# Patient Record
Sex: Male | Born: 1965 | Race: White | Hispanic: No | Marital: Married | Smoking: Never smoker
Health system: Southern US, Community
[De-identification: ages and names within clinical notes are randomized; demographics above are authoritative.]

## PROBLEM LIST (undated history)

## (undated) DIAGNOSIS — Z87442 Personal history of urinary calculi: Secondary | ICD-10-CM

## (undated) DIAGNOSIS — M1990 Unspecified osteoarthritis, unspecified site: Secondary | ICD-10-CM

## (undated) DIAGNOSIS — I10 Essential (primary) hypertension: Secondary | ICD-10-CM

## (undated) DIAGNOSIS — E119 Type 2 diabetes mellitus without complications: Secondary | ICD-10-CM

---

## 1997-07-12 HISTORY — PX: DG THUMB RIGHT HAND (ARMC HX): HXRAD1825

## 1999-03-31 ENCOUNTER — Encounter: Admission: RE | Admit: 1999-03-31 | Discharge: 1999-06-29 | Payer: Self-pay | Admitting: Family Medicine

## 1999-09-08 ENCOUNTER — Encounter: Admission: RE | Admit: 1999-09-08 | Discharge: 1999-12-07 | Payer: Self-pay | Admitting: Family Medicine

## 2018-05-22 ENCOUNTER — Other Ambulatory Visit: Payer: Self-pay | Admitting: Family Medicine

## 2018-05-22 DIAGNOSIS — R109 Unspecified abdominal pain: Secondary | ICD-10-CM

## 2018-05-23 ENCOUNTER — Ambulatory Visit
Admission: RE | Admit: 2018-05-23 | Discharge: 2018-05-23 | Disposition: A | Discharge status: Home / Self Care | Payer: No Typology Code available for payment source | Source: Ambulatory Visit | Attending: Family Medicine | Admitting: Family Medicine

## 2018-05-23 DIAGNOSIS — R109 Unspecified abdominal pain: Secondary | ICD-10-CM

## 2018-12-08 ENCOUNTER — Ambulatory Visit
Admission: RE | Admit: 2018-12-08 | Discharge: 2018-12-08 | Disposition: A | Discharge status: Home / Self Care | Payer: No Typology Code available for payment source | Source: Ambulatory Visit | Attending: Family Medicine | Admitting: Family Medicine

## 2018-12-08 ENCOUNTER — Other Ambulatory Visit: Payer: Self-pay

## 2018-12-08 ENCOUNTER — Other Ambulatory Visit: Payer: Self-pay | Admitting: Family Medicine

## 2018-12-08 DIAGNOSIS — N200 Calculus of kidney: Secondary | ICD-10-CM

## 2019-01-02 ENCOUNTER — Other Ambulatory Visit: Payer: Self-pay | Admitting: Urology

## 2019-01-02 ENCOUNTER — Encounter (HOSPITAL_COMMUNITY): Payer: Self-pay | Admitting: *Deleted

## 2019-01-02 NOTE — Progress Notes (Addendum)
Spoke to patient via phone,history obtained,updated.  Bring blue folder,insurance cards,picture ID,designated driver and living will,POA, if desires (to be placed on chart). Reinforced no aspirin(instructions to hold aspirin per your doctor), ibuprofen products 72 hours prior to procedure. No vitamins or herbal medicines 7 days prior to procedure.   Follow laxative instructions provided by urologist (office) and in blue folder. Wear easy on/off clothing and no jewelry except wedding rings and ear rings. Leave all other valuables at home. Verbalizes understanding of instructions  No alcohol 24 hours prior to procedure.call md office if you have a cold,sorethroat or fever. Shower or bathe before your treatment.NPO after midnight. You will need a covid test prior to our procedure.  01/03/2019 Called patient and informed him that he would not need covid testing as anesthesia is not involved.

## 2019-01-08 ENCOUNTER — Ambulatory Visit (HOSPITAL_COMMUNITY)
Admission: RE | Admit: 2019-01-08 | Discharge: 2019-01-08 | Disposition: A | Discharge status: Home / Self Care | Payer: No Typology Code available for payment source | Source: Other Acute Inpatient Hospital | Attending: Urology | Admitting: Urology

## 2019-01-08 ENCOUNTER — Encounter (HOSPITAL_COMMUNITY): Payer: Self-pay | Admitting: General Practice

## 2019-01-08 ENCOUNTER — Encounter (HOSPITAL_COMMUNITY)
Admission: RE | Disposition: A | Discharge status: Home / Self Care | Payer: Self-pay | Source: Other Acute Inpatient Hospital | Attending: Urology

## 2019-01-08 ENCOUNTER — Ambulatory Visit (HOSPITAL_COMMUNITY): Payer: No Typology Code available for payment source

## 2019-01-08 DIAGNOSIS — Z7984 Long term (current) use of oral hypoglycemic drugs: Secondary | ICD-10-CM | POA: Diagnosis not present

## 2019-01-08 DIAGNOSIS — N201 Calculus of ureter: Secondary | ICD-10-CM | POA: Diagnosis not present

## 2019-01-08 DIAGNOSIS — Z79899 Other long term (current) drug therapy: Secondary | ICD-10-CM | POA: Insufficient documentation

## 2019-01-08 HISTORY — PX: EXTRACORPOREAL SHOCK WAVE LITHOTRIPSY: SHX1557

## 2019-01-08 HISTORY — DX: Unspecified osteoarthritis, unspecified site: M19.90

## 2019-01-08 HISTORY — DX: Type 2 diabetes mellitus without complications: E11.9

## 2019-01-08 HISTORY — DX: Personal history of urinary calculi: Z87.442

## 2019-01-08 HISTORY — DX: Essential (primary) hypertension: I10

## 2019-01-08 LAB — GLUCOSE, CAPILLARY: Glucose-Capillary: 104 mg/dL — ABNORMAL HIGH (ref 70–99)

## 2019-01-08 SURGERY — LITHOTRIPSY, ESWL
Anesthesia: LOCAL | Laterality: Right

## 2019-01-08 MED ORDER — TAMSULOSIN HCL 0.4 MG PO CAPS: 0.4000 mg | ORAL_CAPSULE | Freq: Every day | ORAL | 1 refills | Status: AC

## 2019-01-08 MED ORDER — SODIUM CHLORIDE 0.9 % IV SOLN
INTRAVENOUS | Status: DC
  Administered 2019-01-08: 10:00:00 via INTRAVENOUS

## 2019-01-08 MED ORDER — DIPHENHYDRAMINE HCL 25 MG PO CAPS
25.0000 mg | ORAL_CAPSULE | ORAL | Status: AC
  Administered 2019-01-08: 25 mg via ORAL
  Filled 2019-01-08: qty 1

## 2019-01-08 MED ORDER — DIAZEPAM 5 MG PO TABS
10.0000 mg | ORAL_TABLET | ORAL | Status: AC
  Administered 2019-01-08: 10 mg via ORAL
  Filled 2019-01-08: qty 2

## 2019-01-08 MED ORDER — OXYCODONE-ACETAMINOPHEN 5-325 MG PO TABS: 1.0000 | ORAL_TABLET | ORAL | 0 refills | Status: AC | PRN

## 2019-01-08 MED ORDER — CIPROFLOXACIN HCL 500 MG PO TABS
500.0000 mg | ORAL_TABLET | ORAL | Status: AC
  Administered 2019-01-08: 500 mg via ORAL
  Filled 2019-01-08: qty 1

## 2019-01-08 NOTE — Discharge Instructions (Signed)

## 2019-01-09 ENCOUNTER — Encounter (HOSPITAL_COMMUNITY): Payer: Self-pay | Admitting: Urology

## 2019-02-19 ENCOUNTER — Other Ambulatory Visit: Payer: Self-pay | Admitting: Urology

## 2019-03-13 ENCOUNTER — Ambulatory Visit: Admit: 2019-03-13 | Payer: No Typology Code available for payment source | Admitting: Urology

## 2019-03-13 SURGERY — CYSTOSCOPY/URETEROSCOPY/HOLMIUM LASER/STENT PLACEMENT
Anesthesia: General | Laterality: Right

## 2020-01-13 IMAGING — DX ABDOMEN - 1 VIEW
2 series · 2 of 2 positions shown · non-contrast
Comparison: 12/08/2018 abdominal CT

CLINICAL DATA: Preop for right ureteral stone

EXAM:
ABDOMEN - 1 VIEW

[abdomen kub (1 of 2)]
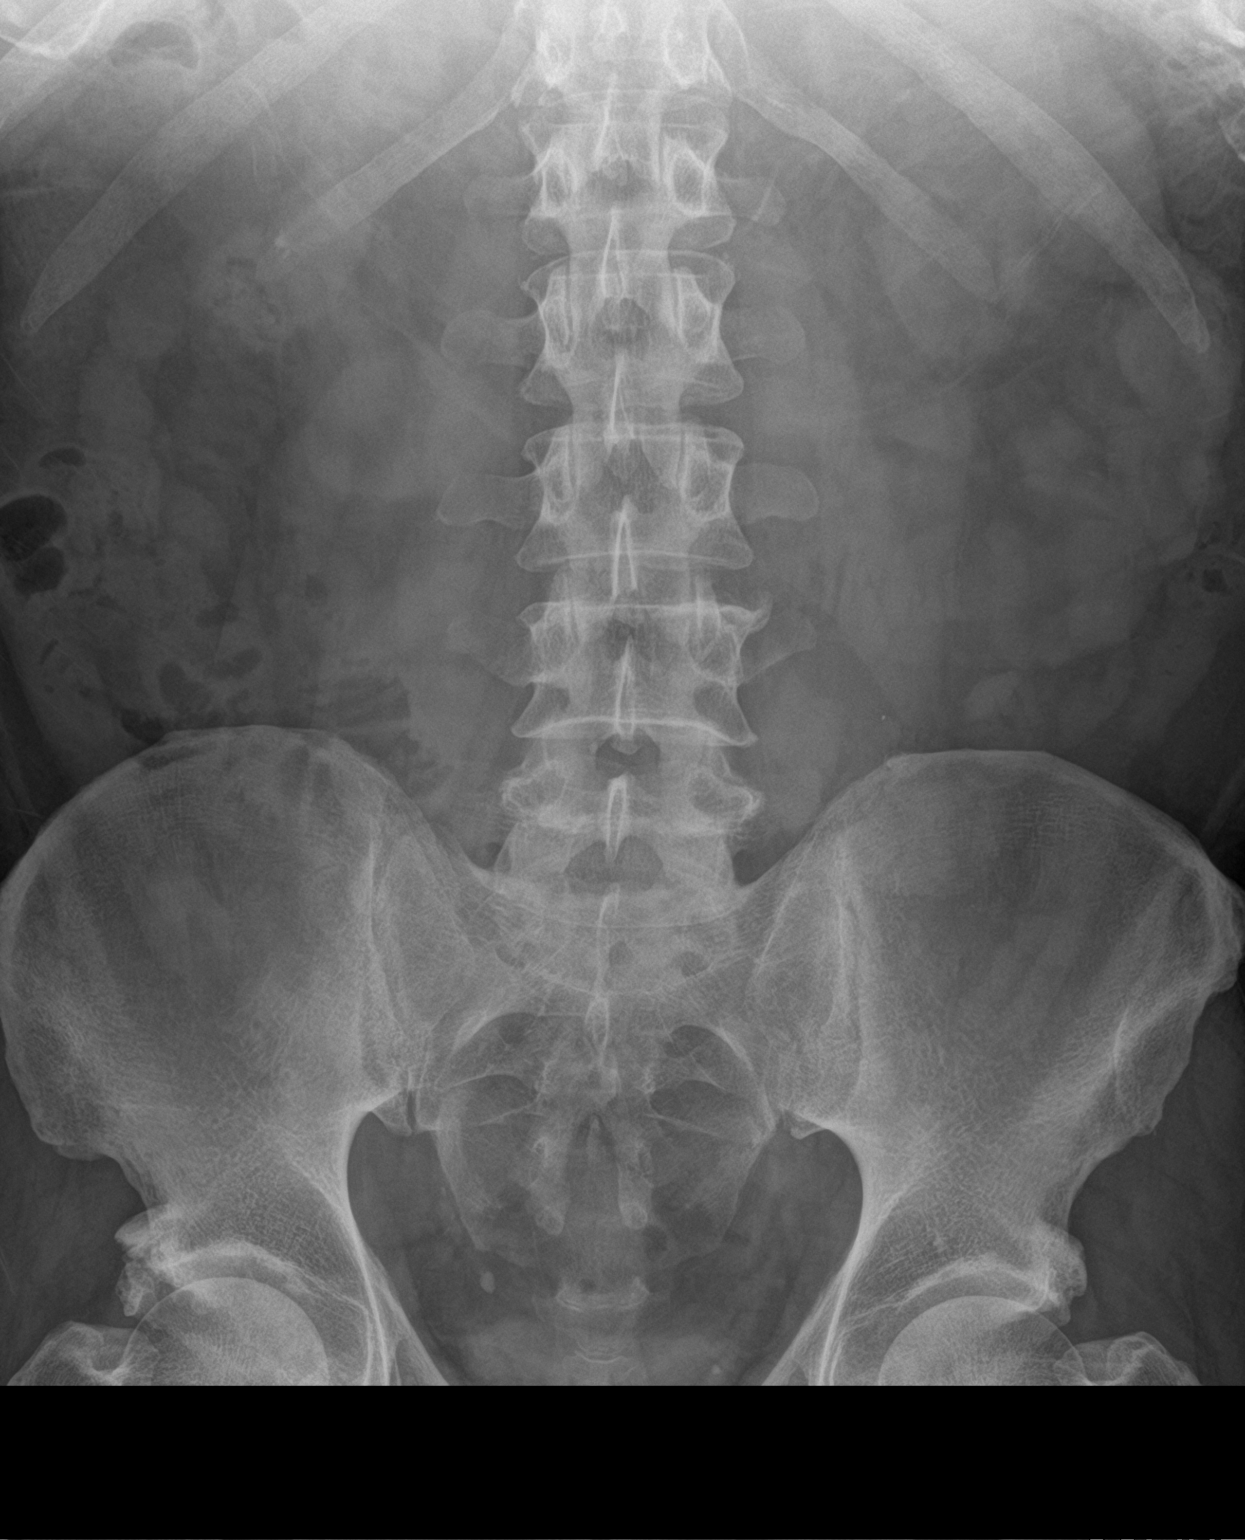

[abdomen kub (2 of 2)]
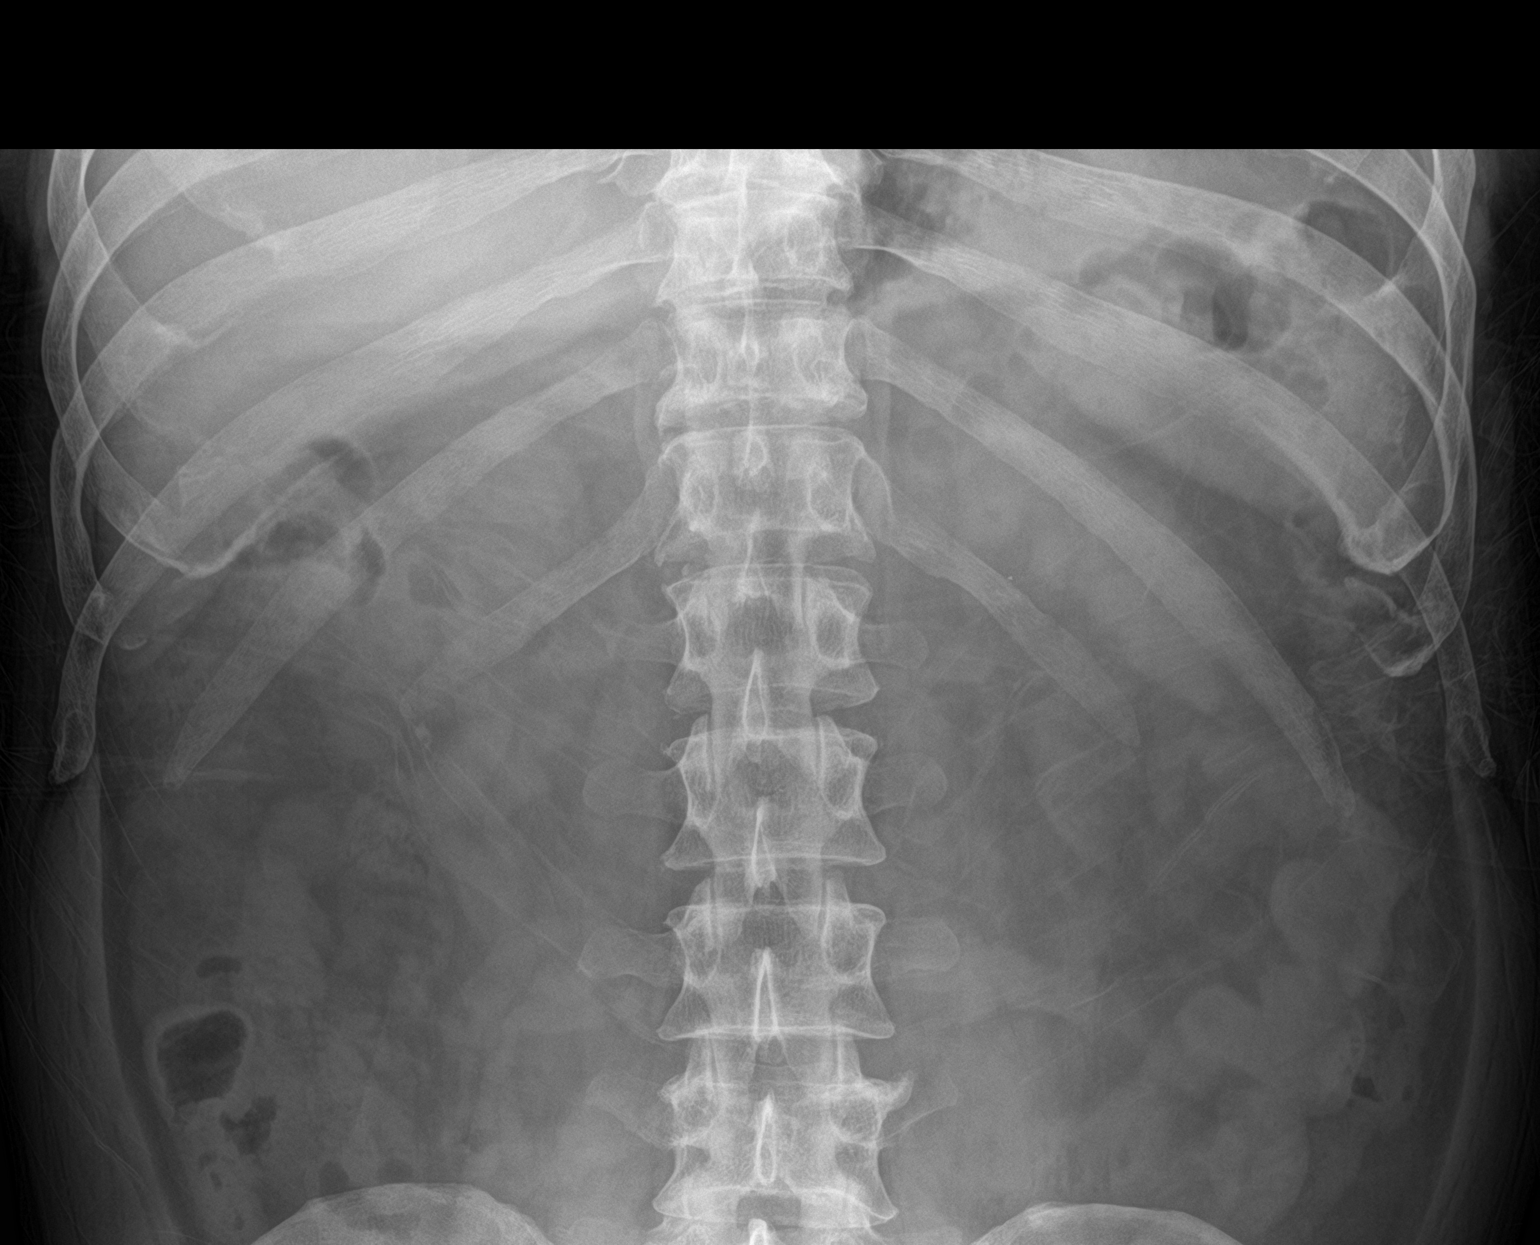

[2 of 2 positions shown; findings below may reference images not displayed]

FINDINGS: Unchanged positioning of a 5 mm stone in the right hemipelvis,
localizing to the distal ureter on prior CT. Vascular calcifications
elsewhere in the pelvis. Two small calculi over the lower pole right
kidney measuring up to 4 mm.
IMPRESSION: Unchanged positioning of 5 mm distal right ureteral calculus.

Right nephrolithiasis.
